# Patient Record
Sex: Male | Born: 1970 | Race: White | Hispanic: No
Health system: Southern US, Community
[De-identification: ages and names within clinical notes are randomized; demographics above are authoritative.]

---

## 2009-07-01 ENCOUNTER — Emergency Department (HOSPITAL_COMMUNITY): Admission: EM | Admit: 2009-07-01 | Discharge: 2009-07-01 | Payer: Self-pay | Admitting: Emergency Medicine

## 2010-01-10 ENCOUNTER — Emergency Department (HOSPITAL_COMMUNITY): Admission: EM | Admit: 2010-01-10 | Discharge: 2010-01-10 | Payer: Self-pay | Admitting: Emergency Medicine

## 2011-03-17 IMAGING — CR DG SHOULDER 2+V*L*
3 series · 3 of 3 positions shown · non-contrast
Comparison: Prior today.

CLINICAL DATA: Shoulder dislocation.  Status post reduction.

LEFT SHOULDER - 2+ VIEW

[view not recorded (1 of 3)]
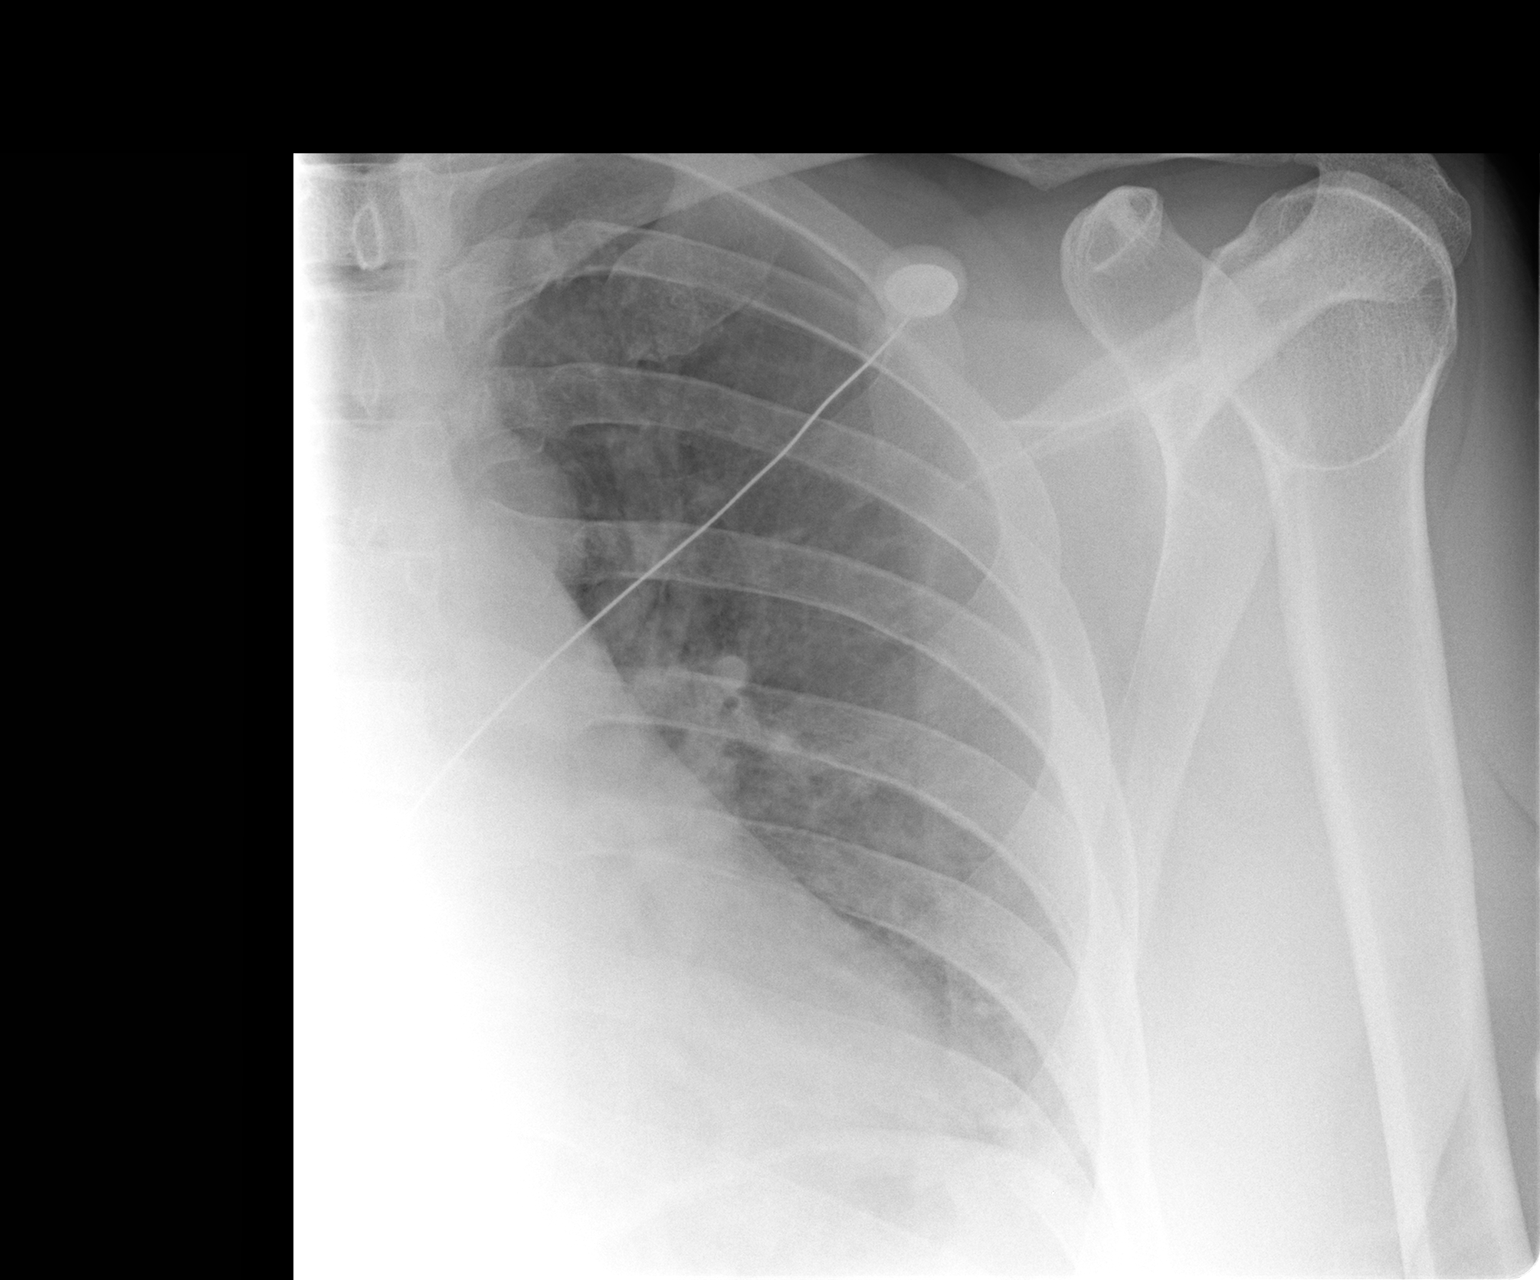

[view not recorded (2 of 3)]
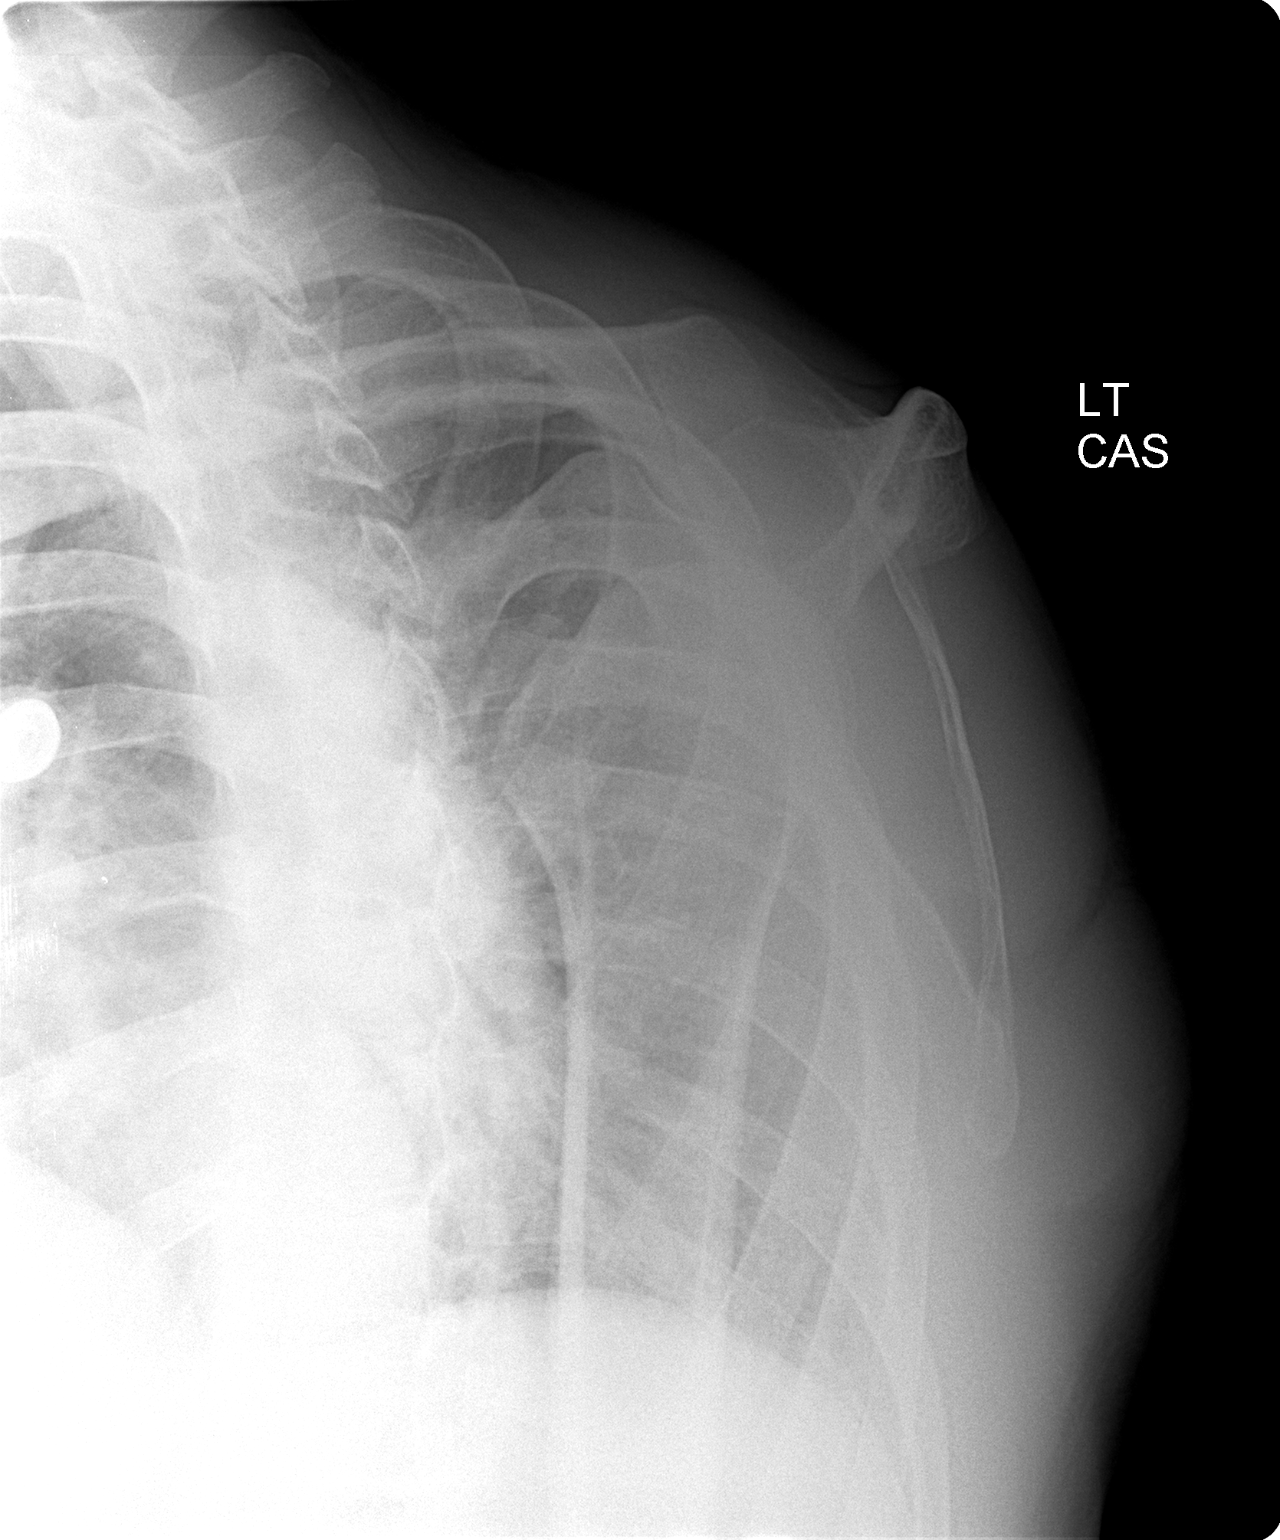

[view not recorded (3 of 3)]
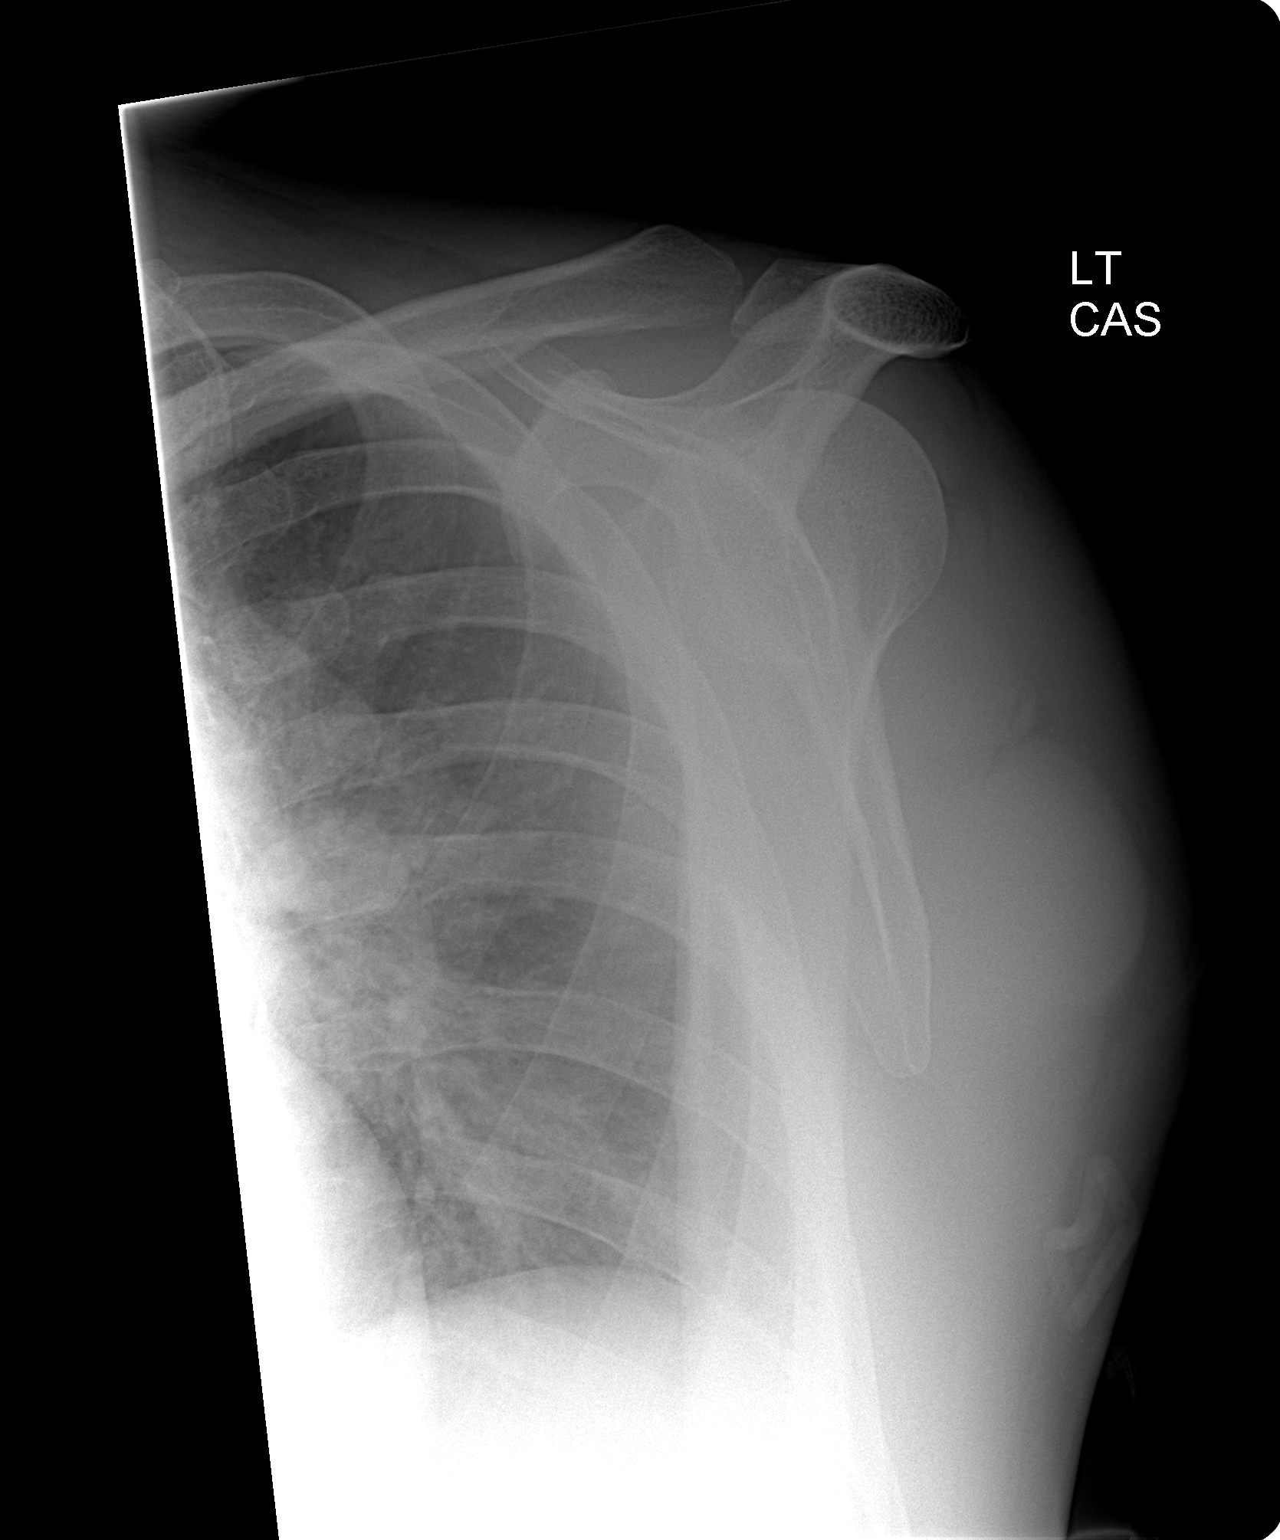

[3 of 3 positions shown; findings below may reference images not displayed]

FINDINGS: There has been successful reduction of previously seen
shoulder dislocation.  Probable reverse Hill-Sachs deformity of the
humeral head is seen.  No other bone abnormality identified.
IMPRESSION: 1.  Successful reduction of the previously seen posterior shoulder
dislocation.
2.  Probable reverse Hill-Sachs deformity of the humeral head.

## 2016-04-14 DIAGNOSIS — R21 Rash and other nonspecific skin eruption: Secondary | ICD-10-CM | POA: Diagnosis not present

## 2016-07-05 DIAGNOSIS — Z23 Encounter for immunization: Secondary | ICD-10-CM | POA: Diagnosis not present

## 2016-07-21 DIAGNOSIS — M25569 Pain in unspecified knee: Secondary | ICD-10-CM | POA: Diagnosis not present

## 2016-07-21 DIAGNOSIS — J309 Allergic rhinitis, unspecified: Secondary | ICD-10-CM | POA: Diagnosis not present

## 2016-07-21 DIAGNOSIS — Z0000 Encounter for general adult medical examination without abnormal findings: Secondary | ICD-10-CM | POA: Diagnosis not present

## 2016-07-21 DIAGNOSIS — B009 Herpesviral infection, unspecified: Secondary | ICD-10-CM | POA: Diagnosis not present

## 2017-05-28 DIAGNOSIS — H5213 Myopia, bilateral: Secondary | ICD-10-CM | POA: Diagnosis not present

## 2017-06-12 DIAGNOSIS — J309 Allergic rhinitis, unspecified: Secondary | ICD-10-CM | POA: Diagnosis not present

## 2017-06-12 DIAGNOSIS — Z0000 Encounter for general adult medical examination without abnormal findings: Secondary | ICD-10-CM | POA: Diagnosis not present

## 2017-06-12 DIAGNOSIS — Z136 Encounter for screening for cardiovascular disorders: Secondary | ICD-10-CM | POA: Diagnosis not present

## 2017-06-12 DIAGNOSIS — M2480 Other specific joint derangements of unspecified joint, not elsewhere classified: Secondary | ICD-10-CM | POA: Diagnosis not present

## 2017-06-12 DIAGNOSIS — R1031 Right lower quadrant pain: Secondary | ICD-10-CM | POA: Diagnosis not present

## 2017-06-14 DIAGNOSIS — K219 Gastro-esophageal reflux disease without esophagitis: Secondary | ICD-10-CM | POA: Diagnosis not present

## 2017-06-14 DIAGNOSIS — R1031 Right lower quadrant pain: Secondary | ICD-10-CM | POA: Diagnosis not present

## 2017-08-24 ENCOUNTER — Other Ambulatory Visit: Payer: Self-pay | Admitting: Gastroenterology

## 2017-08-24 DIAGNOSIS — R1031 Right lower quadrant pain: Secondary | ICD-10-CM

## 2017-08-27 ENCOUNTER — Ambulatory Visit
Admission: RE | Admit: 2017-08-27 | Discharge: 2017-08-27 | Disposition: A | Discharge status: Home / Self Care | Payer: BLUE CROSS/BLUE SHIELD | Source: Ambulatory Visit | Attending: Gastroenterology | Admitting: Gastroenterology

## 2017-08-27 DIAGNOSIS — R1031 Right lower quadrant pain: Secondary | ICD-10-CM

## 2017-08-27 DIAGNOSIS — K429 Umbilical hernia without obstruction or gangrene: Secondary | ICD-10-CM | POA: Diagnosis not present

## 2017-08-27 MED ORDER — IOPAMIDOL (ISOVUE-300) INJECTION 61%
100.0000 mL | Freq: Once | INTRAVENOUS | Status: AC | PRN
  Administered 2017-08-27: 100 mL via INTRAVENOUS

## 2017-08-30 DIAGNOSIS — R1031 Right lower quadrant pain: Secondary | ICD-10-CM | POA: Diagnosis not present

## 2017-12-17 DIAGNOSIS — R1031 Right lower quadrant pain: Secondary | ICD-10-CM | POA: Diagnosis not present

## 2018-06-21 DIAGNOSIS — L218 Other seborrheic dermatitis: Secondary | ICD-10-CM | POA: Diagnosis not present

## 2018-06-21 DIAGNOSIS — J309 Allergic rhinitis, unspecified: Secondary | ICD-10-CM | POA: Diagnosis not present

## 2018-06-21 DIAGNOSIS — E785 Hyperlipidemia, unspecified: Secondary | ICD-10-CM | POA: Diagnosis not present

## 2018-06-21 DIAGNOSIS — Z0000 Encounter for general adult medical examination without abnormal findings: Secondary | ICD-10-CM | POA: Diagnosis not present

## 2018-06-21 DIAGNOSIS — R1031 Right lower quadrant pain: Secondary | ICD-10-CM | POA: Diagnosis not present

## 2018-11-19 DIAGNOSIS — R1031 Right lower quadrant pain: Secondary | ICD-10-CM | POA: Diagnosis not present

## 2019-05-13 IMAGING — CT CT PELVIS W/ CM
1 series · 15 of 32 positions shown, 19 images · IV contrast (APPLIED)
Comparison: None.

CLINICAL DATA: Two-month history of unexplained right lower
quadrant abdominal pain. Possible palpable mass in the right upper
pelvis at clinical examination.

EXAM:
CT PELVIS WITH CONTRAST
TECHNIQUE: Multidetector CT imaging of the pelvis was performed using the
standard protocol following the bolus administration of intravenous
contrast.
CONTRAST:  100mL [id number] IOPAMIDOL INJECTION 61% IV. Oral
contrast was also administered.

[Series 2: routine pelvis w/cm · axial · 0.84mm/px · z∈[-718,-472]mm · 15 of 55 slices shown, 19 images]
[im 4/55  soft-tissue]
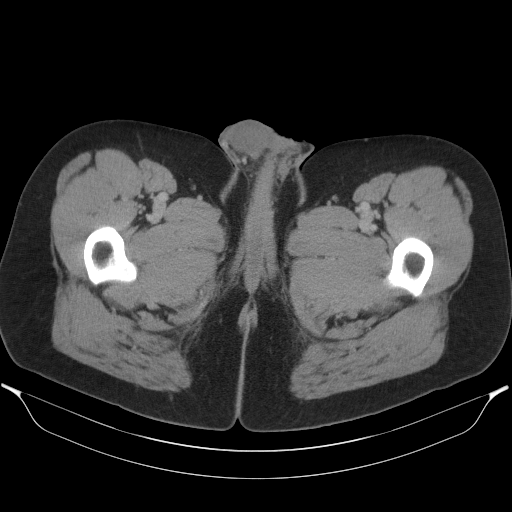
[im 4/55  bone]
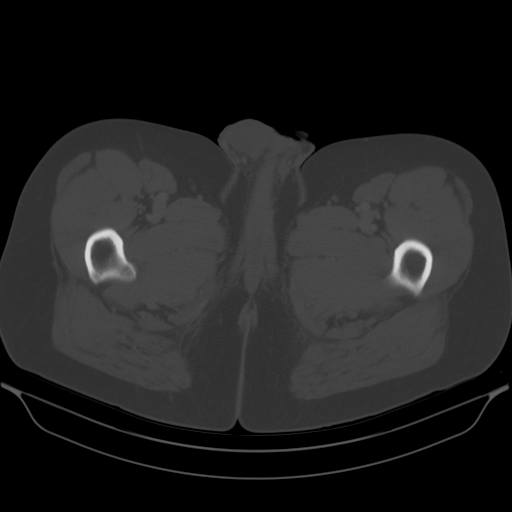
[im 7/55  soft-tissue]
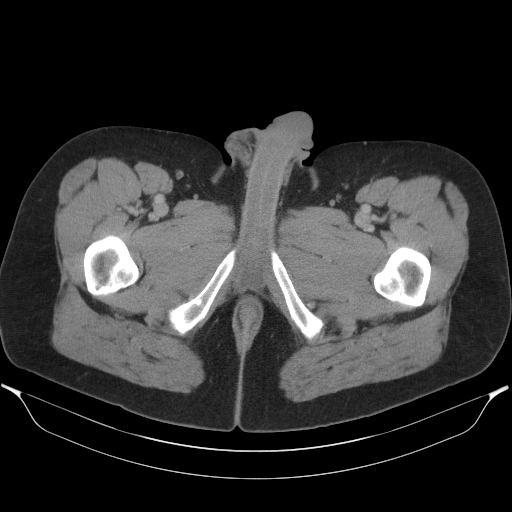
[im 11/55  soft-tissue]
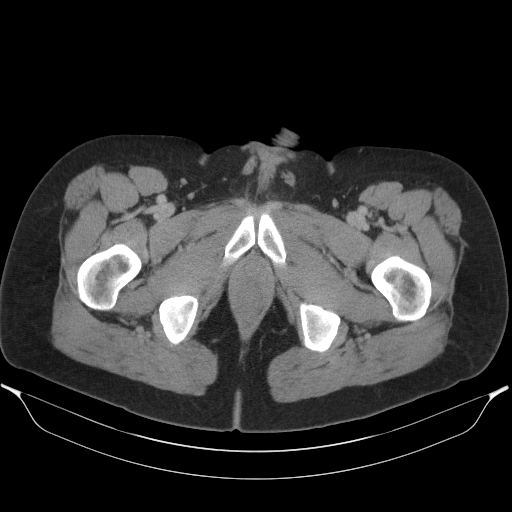
[im 16/55  soft-tissue]
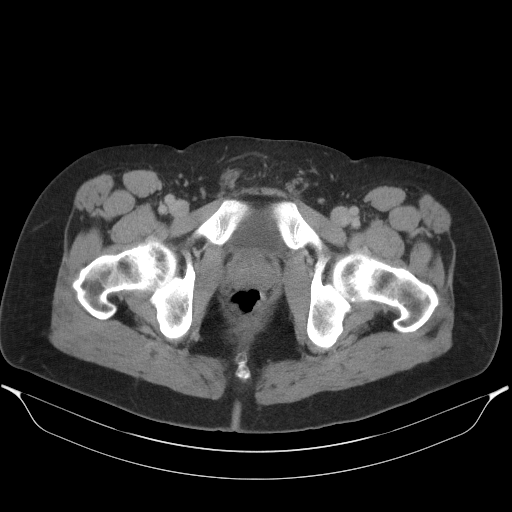
[im 20/55  soft-tissue]
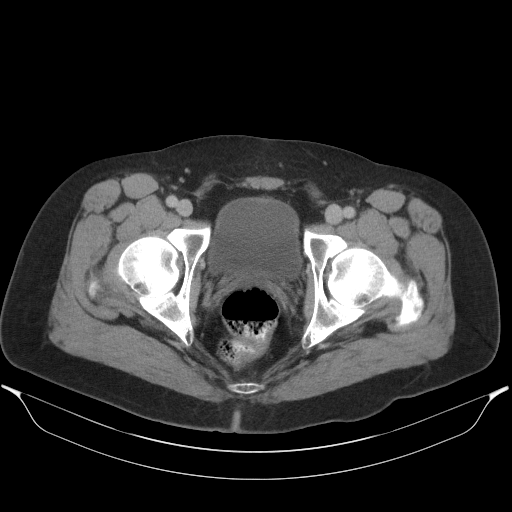
[im 23/55  soft-tissue]
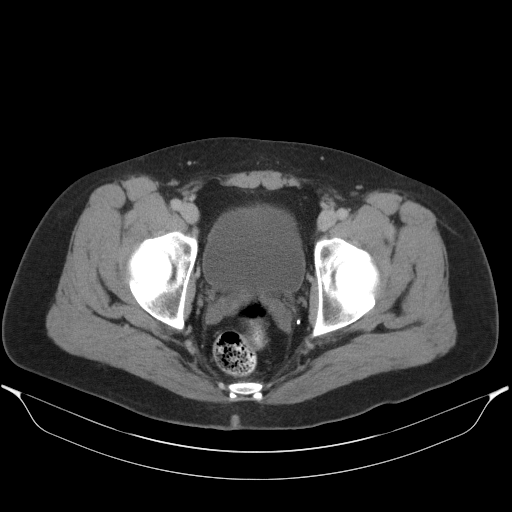
[im 28/55  soft-tissue]
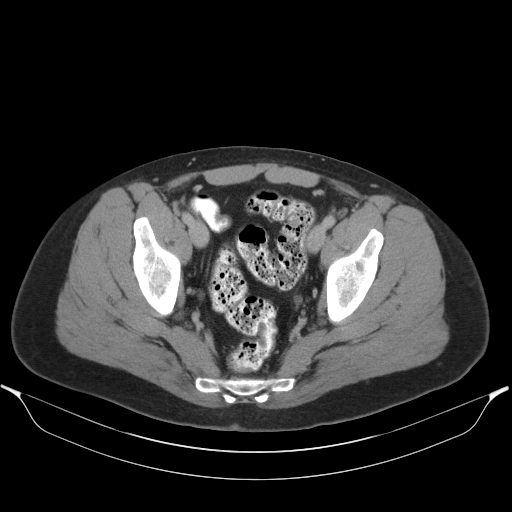
[im 32/55  soft-tissue]
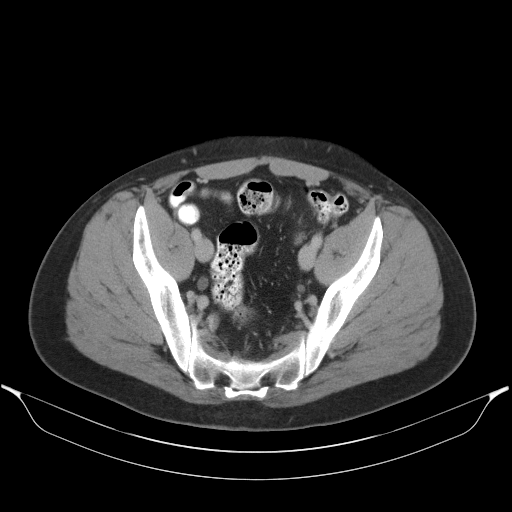
[im 35/55  soft-tissue]
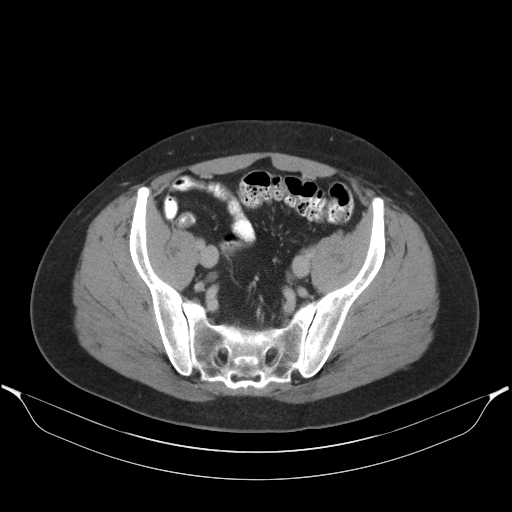
[im 35/55  bone]
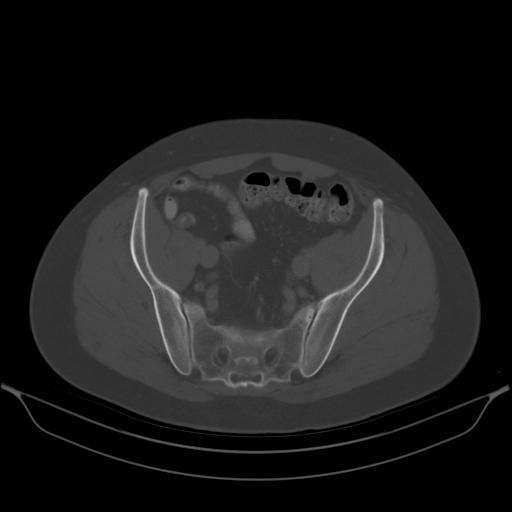
[im 39/55  soft-tissue]
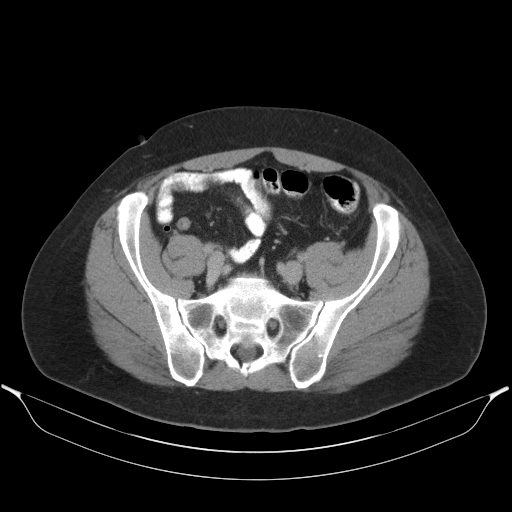
[im 44/55  soft-tissue]
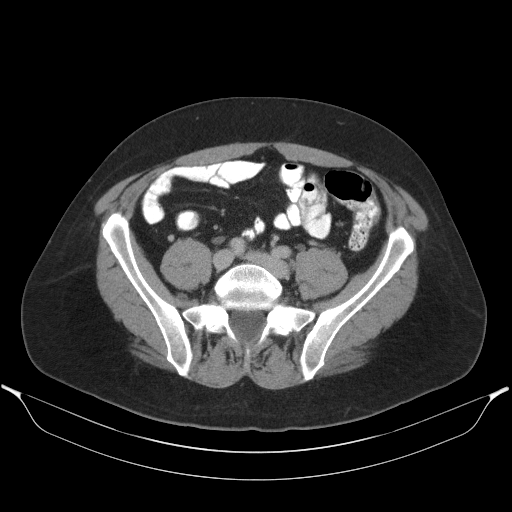
[im 48/55  soft-tissue]
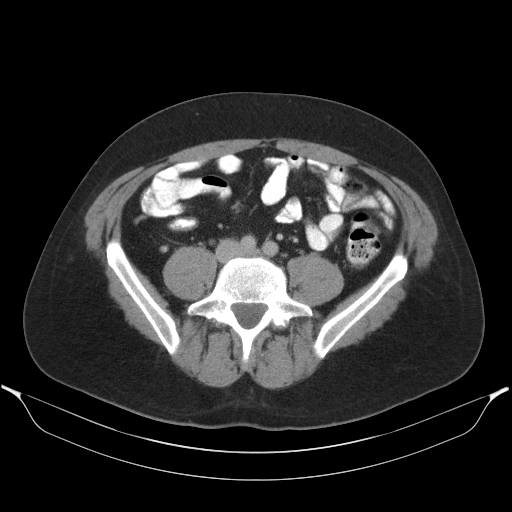
[im 48/55  lung]
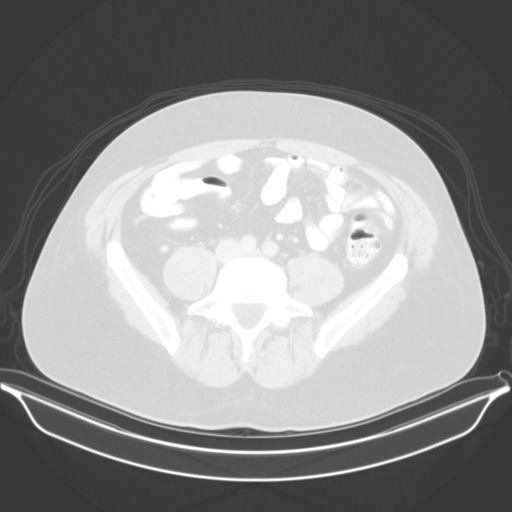
[im 49/55  lung]
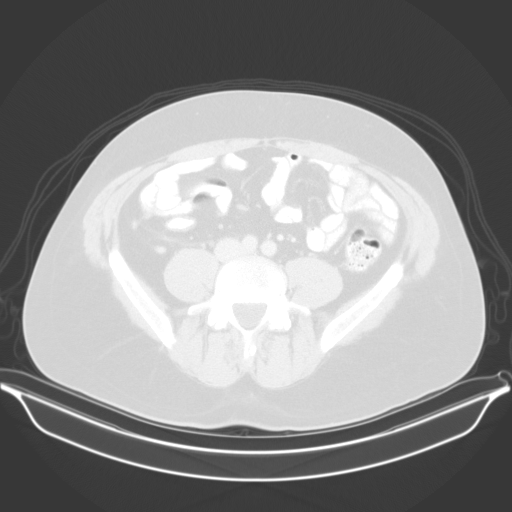
[im 51/55  soft-tissue]
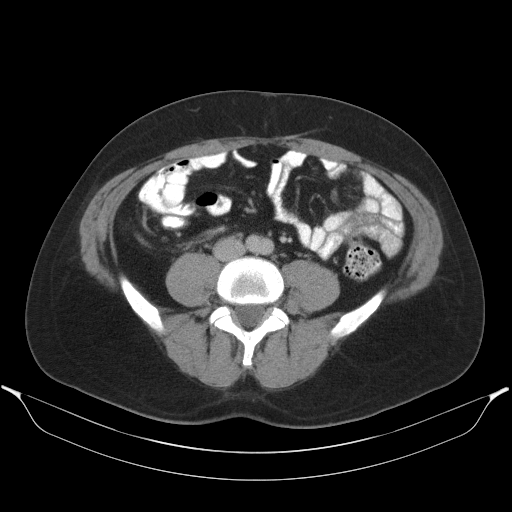
[im 51/55  lung]
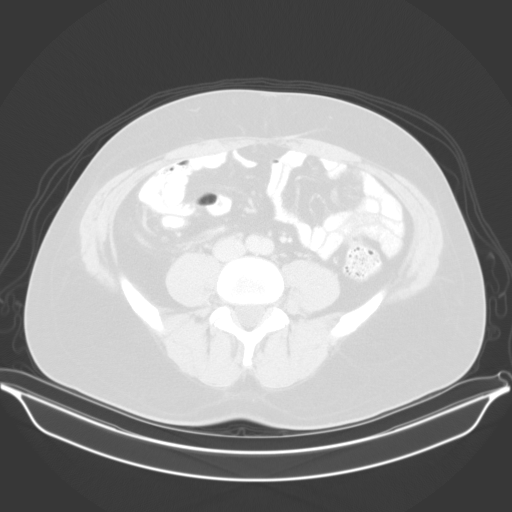
[im 53/55  lung]
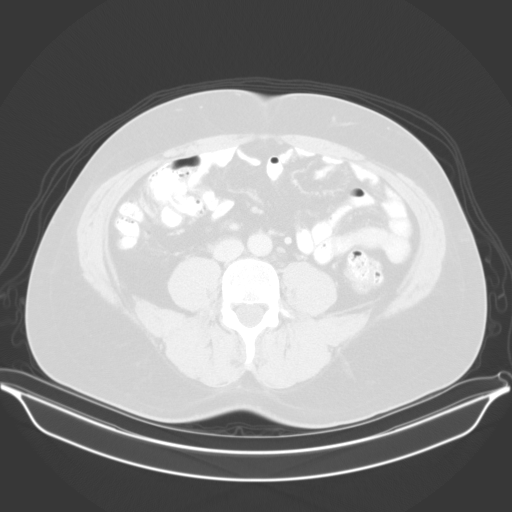

[15 of 32 positions shown; findings below may reference images not displayed]

FINDINGS: Urinary Tract: Normal appearing urinary bladder. No urinary tract
calculi on either side.

Bowel: Visualized colon and small bowel normal in appearance. Normal
appendix in the right upper pelvis.

Vascular/Lymphatic: No visible atherosclerosis involving the distal
abdominal aorta or the iliofemoral arteries.

No pathologic lymphadenopathy.

Reproductive: Prostate gland and seminal vesicles normal in size and
appearance for age.

Other: No abnormality in the area of palpable concern in the right
upper pelvis (marked with a vitamin-E capsule). Small umbilical
hernia containing fat.

Musculoskeletal: Regional skeleton intact without acute or
significant osseous abnormality.
IMPRESSION: 1. No abnormalities identified to explain right lower quadrant
abdominal pain.
2. Small umbilical hernia containing fat. Otherwise normal
examination.

## 2019-05-19 DIAGNOSIS — R1031 Right lower quadrant pain: Secondary | ICD-10-CM | POA: Diagnosis not present

## 2019-05-19 DIAGNOSIS — K5730 Diverticulosis of large intestine without perforation or abscess without bleeding: Secondary | ICD-10-CM | POA: Diagnosis not present

## 2019-05-19 DIAGNOSIS — D123 Benign neoplasm of transverse colon: Secondary | ICD-10-CM | POA: Diagnosis not present

## 2019-05-19 DIAGNOSIS — K648 Other hemorrhoids: Secondary | ICD-10-CM | POA: Diagnosis not present

## 2019-07-15 DIAGNOSIS — R1031 Right lower quadrant pain: Secondary | ICD-10-CM | POA: Diagnosis not present

## 2019-07-15 DIAGNOSIS — K219 Gastro-esophageal reflux disease without esophagitis: Secondary | ICD-10-CM | POA: Diagnosis not present

## 2019-07-15 DIAGNOSIS — E785 Hyperlipidemia, unspecified: Secondary | ICD-10-CM | POA: Diagnosis not present

## 2019-07-15 DIAGNOSIS — J309 Allergic rhinitis, unspecified: Secondary | ICD-10-CM | POA: Diagnosis not present

## 2019-07-15 DIAGNOSIS — Z0000 Encounter for general adult medical examination without abnormal findings: Secondary | ICD-10-CM | POA: Diagnosis not present

## 2019-07-20 DIAGNOSIS — Z20828 Contact with and (suspected) exposure to other viral communicable diseases: Secondary | ICD-10-CM | POA: Diagnosis not present

## 2019-07-31 ENCOUNTER — Other Ambulatory Visit: Payer: Self-pay

## 2019-07-31 DIAGNOSIS — Z20828 Contact with and (suspected) exposure to other viral communicable diseases: Secondary | ICD-10-CM | POA: Diagnosis not present

## 2019-07-31 DIAGNOSIS — Z20822 Contact with and (suspected) exposure to covid-19: Secondary | ICD-10-CM

## 2019-08-01 LAB — NOVEL CORONAVIRUS, NAA: SARS-CoV-2, NAA: NOT DETECTED

## 2019-08-15 DIAGNOSIS — H5213 Myopia, bilateral: Secondary | ICD-10-CM | POA: Diagnosis not present

## 2019-12-01 ENCOUNTER — Ambulatory Visit: Payer: BC Managed Care – PPO | Attending: Internal Medicine

## 2019-12-01 DIAGNOSIS — Z20822 Contact with and (suspected) exposure to covid-19: Secondary | ICD-10-CM

## 2019-12-02 LAB — NOVEL CORONAVIRUS, NAA: SARS-CoV-2, NAA: NOT DETECTED

## 2020-01-21 ENCOUNTER — Ambulatory Visit: Payer: BC Managed Care – PPO | Attending: Internal Medicine

## 2020-01-21 DIAGNOSIS — Z20822 Contact with and (suspected) exposure to covid-19: Secondary | ICD-10-CM

## 2020-01-22 LAB — NOVEL CORONAVIRUS, NAA: SARS-CoV-2, NAA: NOT DETECTED

## 2020-04-30 DIAGNOSIS — M79671 Pain in right foot: Secondary | ICD-10-CM | POA: Diagnosis not present

## 2020-04-30 DIAGNOSIS — R030 Elevated blood-pressure reading, without diagnosis of hypertension: Secondary | ICD-10-CM | POA: Diagnosis not present

## 2020-07-15 DIAGNOSIS — E785 Hyperlipidemia, unspecified: Secondary | ICD-10-CM | POA: Diagnosis not present

## 2020-07-15 DIAGNOSIS — Z0000 Encounter for general adult medical examination without abnormal findings: Secondary | ICD-10-CM | POA: Diagnosis not present

## 2020-07-15 DIAGNOSIS — Z23 Encounter for immunization: Secondary | ICD-10-CM | POA: Diagnosis not present

## 2020-07-15 DIAGNOSIS — K5730 Diverticulosis of large intestine without perforation or abscess without bleeding: Secondary | ICD-10-CM | POA: Diagnosis not present

## 2020-07-15 DIAGNOSIS — L218 Other seborrheic dermatitis: Secondary | ICD-10-CM | POA: Diagnosis not present

## 2020-07-15 DIAGNOSIS — E559 Vitamin D deficiency, unspecified: Secondary | ICD-10-CM | POA: Diagnosis not present

## 2020-07-15 DIAGNOSIS — K219 Gastro-esophageal reflux disease without esophagitis: Secondary | ICD-10-CM | POA: Diagnosis not present

## 2020-07-15 DIAGNOSIS — R1031 Right lower quadrant pain: Secondary | ICD-10-CM | POA: Diagnosis not present

## 2020-10-12 DIAGNOSIS — Z20822 Contact with and (suspected) exposure to covid-19: Secondary | ICD-10-CM | POA: Diagnosis not present

## 2020-12-20 DIAGNOSIS — M25569 Pain in unspecified knee: Secondary | ICD-10-CM | POA: Diagnosis not present

## 2020-12-28 ENCOUNTER — Ambulatory Visit (INDEPENDENT_AMBULATORY_CARE_PROVIDER_SITE_OTHER): Payer: BLUE CROSS/BLUE SHIELD | Admitting: Orthopaedic Surgery

## 2020-12-28 ENCOUNTER — Encounter: Payer: Self-pay | Admitting: Orthopaedic Surgery

## 2020-12-28 ENCOUNTER — Ambulatory Visit: Payer: Self-pay

## 2020-12-28 VITALS — Ht 75.0 in | Wt 230.0 lb

## 2020-12-28 DIAGNOSIS — M25562 Pain in left knee: Secondary | ICD-10-CM | POA: Diagnosis not present

## 2020-12-28 MED ORDER — BUPIVACAINE HCL 0.25 % IJ SOLN
2.0000 mL | INTRAMUSCULAR | Status: AC | PRN
  Administered 2020-12-28: 2 mL via INTRA_ARTICULAR

## 2020-12-28 MED ORDER — LIDOCAINE HCL 1 % IJ SOLN
2.0000 mL | INTRAMUSCULAR | Status: AC | PRN
  Administered 2020-12-28: 2 mL

## 2020-12-28 MED ORDER — METHYLPREDNISOLONE ACETATE 40 MG/ML IJ SUSP
40.0000 mg | INTRAMUSCULAR | Status: AC | PRN
  Administered 2020-12-28: 40 mg via INTRA_ARTICULAR

## 2020-12-28 NOTE — Progress Notes (Signed)
   Office Visit Note   Patient: Albert Tucker           Date of Birth: 1970-12-29           MRN: [id number] Visit Date: 12/28/2020              Requested by: Collene Mares, PA [address] [place],  [place] [address] PCP: No primary care provider on file.   Assessment & Plan: Visit Diagnoses:  1. Acute pain of left knee     Plan: Impression is left knee degenerative medial meniscus tear.  We have discussed cortisone injection for which he would like to proceed.  He will follow up with [place] as needed.  Follow-Up Instructions: Return if symptoms worsen or fail to improve.   Orders:  Orders Placed This Encounter  Procedures  . Large Joint Inj: L knee  . XR KNEE 3 VIEW LEFT   No orders of the defined types were placed in this encounter.     Procedures: Large Joint Inj: L knee on 12/28/2020 8:54 AM Indications: pain Details: 22 G needle, anterolateral approach Medications: 2 mL lidocaine 1 %; 2 mL bupivacaine 0.25 %; 40 mg methylPREDNISolone acetate 40 MG/ML      Clinical Data: No additional findings.   Subjective: Chief Complaint  Patient presents with  . Left Knee - Pain    HPI patient is a very pleasant 50 year old gentleman who comes in today with left knee pain.  He notes that about 2 weeks ago he was walking his dog when all of a sudden he felt a sharp shooting pain to the medial aspect.  He has had pain since which has occurred with pivoting as well as going on long hikes.  He describes this as sharp and shooting in nature.  He has been taking ibuprofen without significant relief of symptoms.  He denies any mechanical symptoms or instability.  He does note that he had a left knee arthroscopy about 30 years ago and has done well since with only occasional aches with overuse.  Review of Systems as detailed in HPI.  All others reviewed and are negative.   Objective: Vital Signs: Ht 6\' 3"  (1.905 m)   Wt 230 lb (104.3 kg)   BMI 28.75 kg/m    Physical Exam well-developed well-nourished gentleman in no acute distress.  Alert oriented x3.  Ortho Exam left knee exam shows trace effusion.  Range of motion 0 to 120 degrees.  Medial joint line tenderness.  He does have slight increased pain with valgus stress but there is no laxity.  He is neurovascularly intact distally.  Specialty Comments:  No specialty comments available.  Imaging: XR KNEE 3 VIEW LEFT  Result Date: 12/28/2020 X-rays demonstrate moderate degenerative changes to the medial compartment    PMFS History: There are no problems to display for this patient.  History reviewed. No pertinent past medical history.  History reviewed. No pertinent family history.  History reviewed. No pertinent surgical history. Social History   Occupational History  . Not on file  Tobacco Use  . Smoking status: Not on file  . Smokeless tobacco: Not on file  Substance and Sexual Activity  . Alcohol use: Not on file  . Drug use: Not on file  . Sexual activity: Not on file

## 2021-03-18 DIAGNOSIS — G4700 Insomnia, unspecified: Secondary | ICD-10-CM | POA: Diagnosis not present

## 2021-07-18 DIAGNOSIS — Z0001 Encounter for general adult medical examination with abnormal findings: Secondary | ICD-10-CM | POA: Diagnosis not present

## 2021-07-18 DIAGNOSIS — K219 Gastro-esophageal reflux disease without esophagitis: Secondary | ICD-10-CM | POA: Diagnosis not present

## 2021-07-18 DIAGNOSIS — Z23 Encounter for immunization: Secondary | ICD-10-CM | POA: Diagnosis not present

## 2021-07-18 DIAGNOSIS — R1031 Right lower quadrant pain: Secondary | ICD-10-CM | POA: Diagnosis not present

## 2021-07-18 DIAGNOSIS — E785 Hyperlipidemia, unspecified: Secondary | ICD-10-CM | POA: Diagnosis not present

## 2021-07-18 DIAGNOSIS — G4700 Insomnia, unspecified: Secondary | ICD-10-CM | POA: Diagnosis not present

## 2021-09-08 DIAGNOSIS — R140 Abdominal distension (gaseous): Secondary | ICD-10-CM | POA: Diagnosis not present

## 2021-09-08 DIAGNOSIS — K219 Gastro-esophageal reflux disease without esophagitis: Secondary | ICD-10-CM | POA: Diagnosis not present

## 2022-02-21 DIAGNOSIS — K219 Gastro-esophageal reflux disease without esophagitis: Secondary | ICD-10-CM | POA: Diagnosis not present

## 2022-02-21 DIAGNOSIS — K2930 Chronic superficial gastritis without bleeding: Secondary | ICD-10-CM | POA: Diagnosis not present

## 2022-02-21 DIAGNOSIS — R140 Abdominal distension (gaseous): Secondary | ICD-10-CM | POA: Diagnosis not present

## 2022-02-21 DIAGNOSIS — K3189 Other diseases of stomach and duodenum: Secondary | ICD-10-CM | POA: Diagnosis not present

## 2022-02-21 DIAGNOSIS — K2980 Duodenitis without bleeding: Secondary | ICD-10-CM | POA: Diagnosis not present

## 2022-07-21 DIAGNOSIS — Z5181 Encounter for therapeutic drug level monitoring: Secondary | ICD-10-CM | POA: Diagnosis not present

## 2022-07-21 DIAGNOSIS — D649 Anemia, unspecified: Secondary | ICD-10-CM | POA: Diagnosis not present

## 2022-07-21 DIAGNOSIS — E785 Hyperlipidemia, unspecified: Secondary | ICD-10-CM | POA: Diagnosis not present

## 2022-07-21 DIAGNOSIS — K219 Gastro-esophageal reflux disease without esophagitis: Secondary | ICD-10-CM | POA: Diagnosis not present

## 2022-07-21 DIAGNOSIS — Z23 Encounter for immunization: Secondary | ICD-10-CM | POA: Diagnosis not present

## 2022-07-21 DIAGNOSIS — Z0000 Encounter for general adult medical examination without abnormal findings: Secondary | ICD-10-CM | POA: Diagnosis not present

## 2022-07-31 DIAGNOSIS — D649 Anemia, unspecified: Secondary | ICD-10-CM | POA: Diagnosis not present

## 2023-02-08 DIAGNOSIS — D509 Iron deficiency anemia, unspecified: Secondary | ICD-10-CM | POA: Diagnosis not present

## 2023-02-21 DIAGNOSIS — J029 Acute pharyngitis, unspecified: Secondary | ICD-10-CM | POA: Diagnosis not present

## 2023-03-15 DIAGNOSIS — F1010 Alcohol abuse, uncomplicated: Secondary | ICD-10-CM | POA: Diagnosis not present

## 2023-03-15 DIAGNOSIS — R030 Elevated blood-pressure reading, without diagnosis of hypertension: Secondary | ICD-10-CM | POA: Diagnosis not present

## 2023-05-17 DIAGNOSIS — H524 Presbyopia: Secondary | ICD-10-CM | POA: Diagnosis not present

## 2023-05-23 DIAGNOSIS — R238 Other skin changes: Secondary | ICD-10-CM | POA: Diagnosis not present

## 2023-05-23 DIAGNOSIS — K130 Diseases of lips: Secondary | ICD-10-CM | POA: Diagnosis not present

## 2023-05-31 DIAGNOSIS — L988 Other specified disorders of the skin and subcutaneous tissue: Secondary | ICD-10-CM | POA: Diagnosis not present

## 2023-08-10 DIAGNOSIS — Z5181 Encounter for therapeutic drug level monitoring: Secondary | ICD-10-CM | POA: Diagnosis not present

## 2023-08-10 DIAGNOSIS — E611 Iron deficiency: Secondary | ICD-10-CM | POA: Diagnosis not present

## 2023-08-10 DIAGNOSIS — Z23 Encounter for immunization: Secondary | ICD-10-CM | POA: Diagnosis not present

## 2023-08-10 DIAGNOSIS — Z125 Encounter for screening for malignant neoplasm of prostate: Secondary | ICD-10-CM | POA: Diagnosis not present

## 2023-08-10 DIAGNOSIS — E785 Hyperlipidemia, unspecified: Secondary | ICD-10-CM | POA: Diagnosis not present

## 2023-08-10 DIAGNOSIS — Z0000 Encounter for general adult medical examination without abnormal findings: Secondary | ICD-10-CM | POA: Diagnosis not present

## 2023-08-23 DIAGNOSIS — G4719 Other hypersomnia: Secondary | ICD-10-CM | POA: Diagnosis not present

## 2023-08-31 DIAGNOSIS — Z23 Encounter for immunization: Secondary | ICD-10-CM | POA: Diagnosis not present

## 2023-09-04 DIAGNOSIS — H5213 Myopia, bilateral: Secondary | ICD-10-CM | POA: Diagnosis not present

## 2023-09-18 DIAGNOSIS — G4733 Obstructive sleep apnea (adult) (pediatric): Secondary | ICD-10-CM | POA: Diagnosis not present

## 2023-09-19 DIAGNOSIS — G4733 Obstructive sleep apnea (adult) (pediatric): Secondary | ICD-10-CM | POA: Diagnosis not present

## 2023-12-14 DIAGNOSIS — Z23 Encounter for immunization: Secondary | ICD-10-CM | POA: Diagnosis not present

## 2024-02-22 DIAGNOSIS — G4733 Obstructive sleep apnea (adult) (pediatric): Secondary | ICD-10-CM | POA: Diagnosis not present

## 2024-02-22 DIAGNOSIS — E785 Hyperlipidemia, unspecified: Secondary | ICD-10-CM | POA: Diagnosis not present

## 2024-02-22 DIAGNOSIS — F1010 Alcohol abuse, uncomplicated: Secondary | ICD-10-CM | POA: Diagnosis not present

## 2024-02-22 DIAGNOSIS — G4700 Insomnia, unspecified: Secondary | ICD-10-CM | POA: Diagnosis not present

## 2024-03-13 DIAGNOSIS — E669 Obesity, unspecified: Secondary | ICD-10-CM | POA: Diagnosis not present

## 2024-03-13 DIAGNOSIS — I10 Essential (primary) hypertension: Secondary | ICD-10-CM | POA: Diagnosis not present

## 2024-03-13 DIAGNOSIS — F1010 Alcohol abuse, uncomplicated: Secondary | ICD-10-CM | POA: Diagnosis not present

## 2024-03-13 DIAGNOSIS — G4733 Obstructive sleep apnea (adult) (pediatric): Secondary | ICD-10-CM | POA: Diagnosis not present

## 2024-05-14 DIAGNOSIS — M25512 Pain in left shoulder: Secondary | ICD-10-CM | POA: Diagnosis not present

## 2024-05-14 DIAGNOSIS — S43005A Unspecified dislocation of left shoulder joint, initial encounter: Secondary | ICD-10-CM | POA: Diagnosis not present

## 2024-05-14 DIAGNOSIS — S43025A Posterior dislocation of left humerus, initial encounter: Secondary | ICD-10-CM | POA: Diagnosis not present

## 2024-05-14 DIAGNOSIS — X501XXA Overexertion from prolonged static or awkward postures, initial encounter: Secondary | ICD-10-CM | POA: Diagnosis not present

## 2024-05-21 DIAGNOSIS — S93491A Sprain of other ligament of right ankle, initial encounter: Secondary | ICD-10-CM | POA: Diagnosis not present

## 2024-06-18 DIAGNOSIS — R002 Palpitations: Secondary | ICD-10-CM | POA: Diagnosis not present

## 2024-07-02 DIAGNOSIS — R002 Palpitations: Secondary | ICD-10-CM | POA: Diagnosis not present

## 2024-07-16 DIAGNOSIS — R002 Palpitations: Secondary | ICD-10-CM | POA: Diagnosis not present

## 2024-08-15 DIAGNOSIS — Z5181 Encounter for therapeutic drug level monitoring: Secondary | ICD-10-CM | POA: Diagnosis not present

## 2024-08-15 DIAGNOSIS — Z125 Encounter for screening for malignant neoplasm of prostate: Secondary | ICD-10-CM | POA: Diagnosis not present

## 2024-08-15 DIAGNOSIS — I10 Essential (primary) hypertension: Secondary | ICD-10-CM | POA: Diagnosis not present

## 2024-08-15 DIAGNOSIS — E6609 Other obesity due to excess calories: Secondary | ICD-10-CM | POA: Diagnosis not present

## 2024-08-15 DIAGNOSIS — Z23 Encounter for immunization: Secondary | ICD-10-CM | POA: Diagnosis not present

## 2024-08-15 DIAGNOSIS — G4733 Obstructive sleep apnea (adult) (pediatric): Secondary | ICD-10-CM | POA: Diagnosis not present

## 2024-08-15 DIAGNOSIS — E785 Hyperlipidemia, unspecified: Secondary | ICD-10-CM | POA: Diagnosis not present

## 2024-08-15 DIAGNOSIS — F1010 Alcohol abuse, uncomplicated: Secondary | ICD-10-CM | POA: Diagnosis not present

## 2024-08-15 DIAGNOSIS — Z0000 Encounter for general adult medical examination without abnormal findings: Secondary | ICD-10-CM | POA: Diagnosis not present

## 2024-11-27 ENCOUNTER — Other Ambulatory Visit: Payer: Self-pay | Admitting: Medical Genetics
